# Patient Record
Sex: Male | Born: 1965 | Race: White | Hispanic: No | Marital: Married | Smoking: Never smoker
Health system: Southern US, Community
[De-identification: ages and names within clinical notes are randomized; demographics above are authoritative.]

## PROBLEM LIST (undated history)

## (undated) DIAGNOSIS — T7840XA Allergy, unspecified, initial encounter: Secondary | ICD-10-CM

## (undated) HISTORY — DX: Allergy, unspecified, initial encounter: T78.40XA

---

## 1996-08-15 HISTORY — PX: AMPUTATION FINGER: SHX6594

## 2009-07-01 ENCOUNTER — Encounter: Admission: RE | Admit: 2009-07-01 | Discharge: 2009-07-01 | Payer: Self-pay | Admitting: Family Medicine

## 2010-04-02 IMAGING — CR DG LUMBAR SPINE COMPLETE 4+V
5 series · 5 of 5 positions shown · non-contrast
Comparison: None

CLINICAL DATA: Low back pain.

LUMBAR SPINE - COMPLETE 4+ VIEW

[view not recorded (1 of 5)]
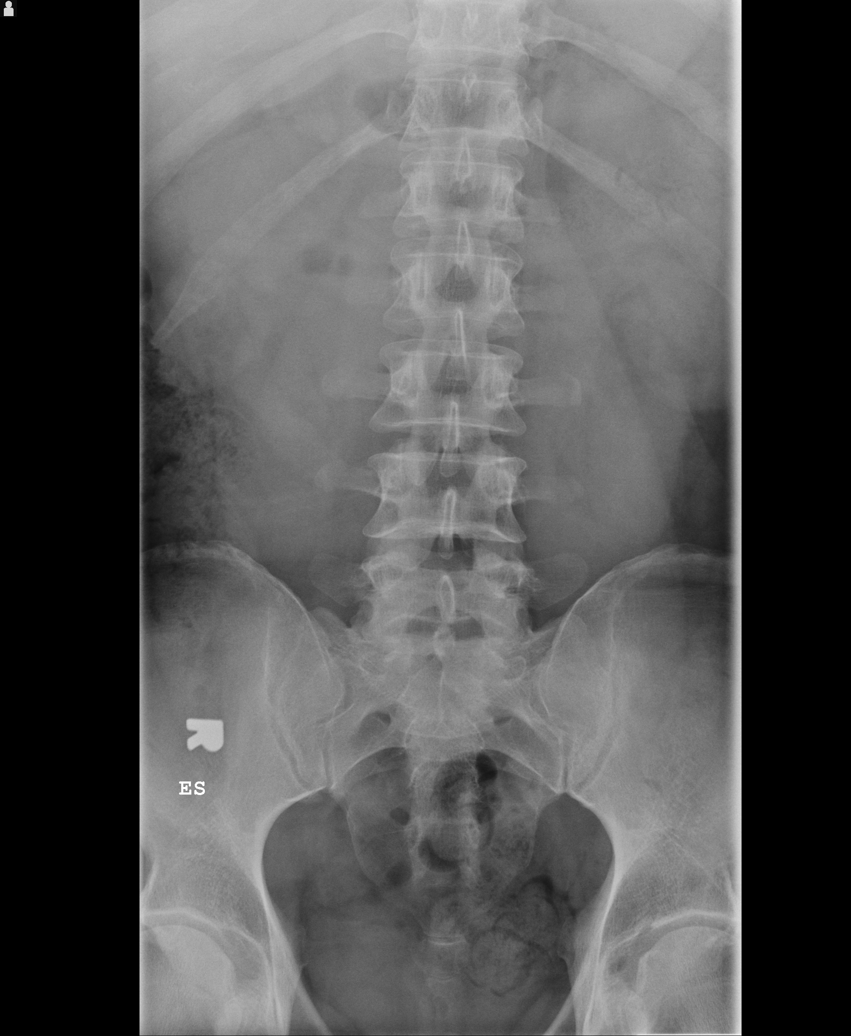

[view not recorded (2 of 5)]
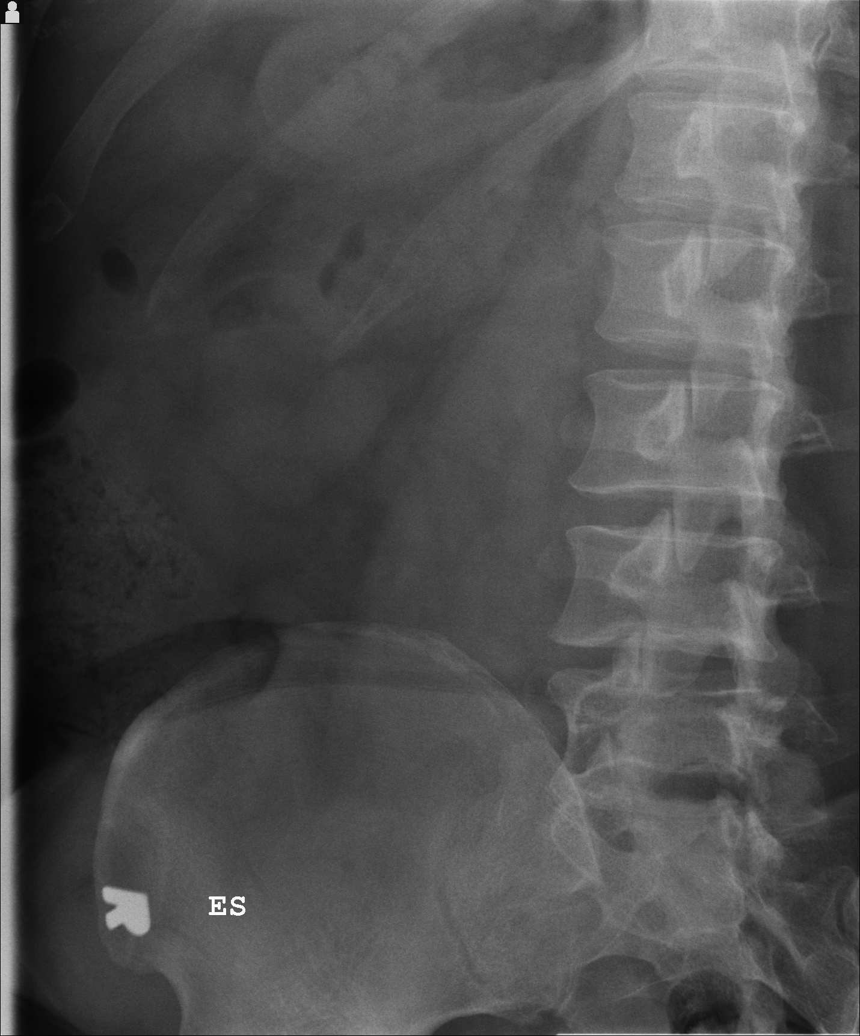

[view not recorded (3 of 5)]
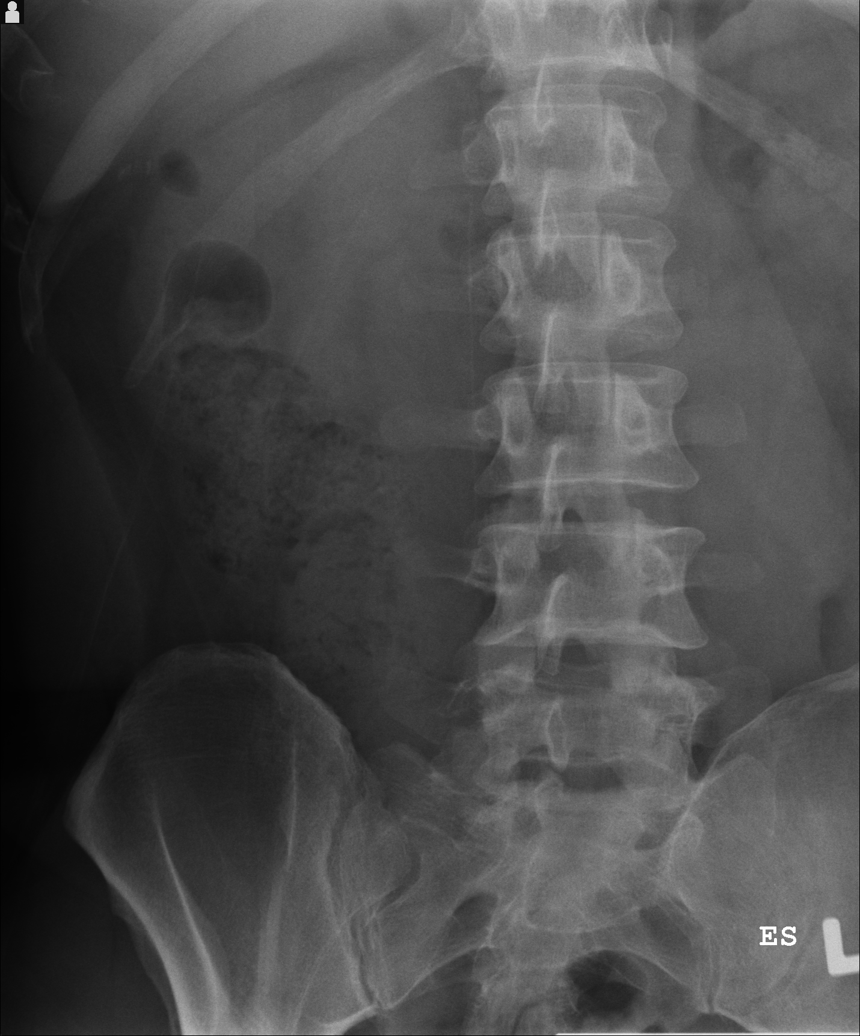

[view not recorded (4 of 5)]
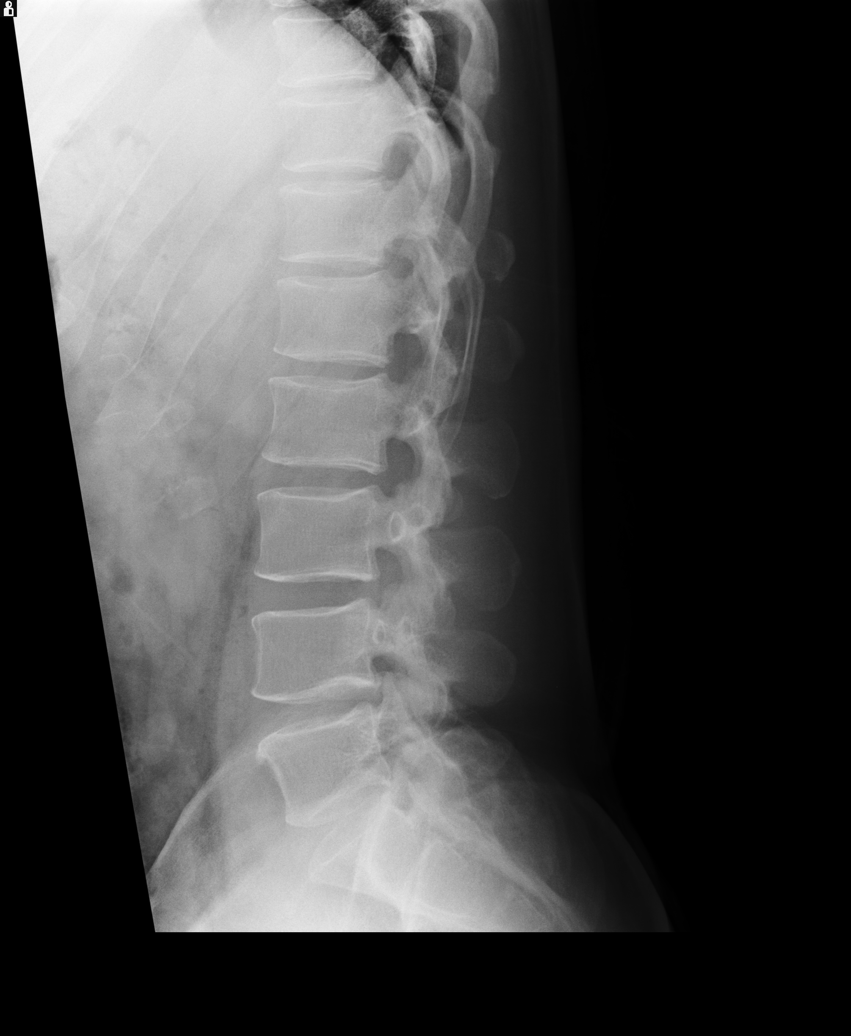

[view not recorded (5 of 5)]
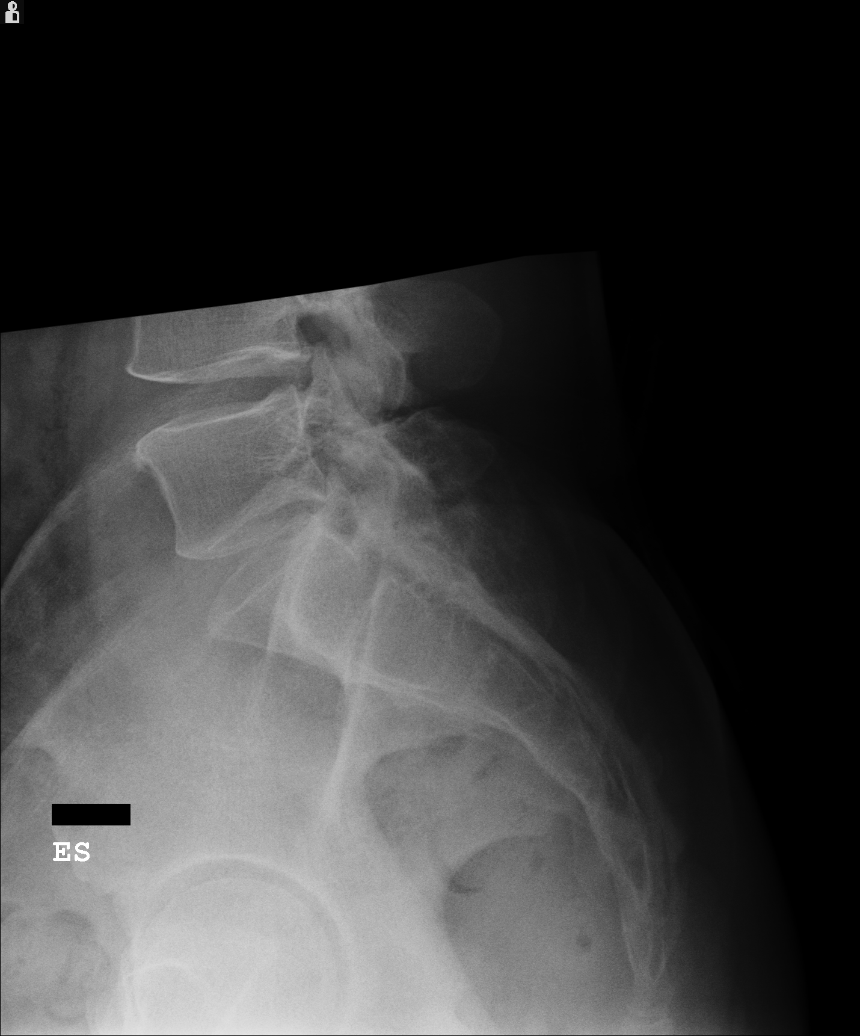

[5 of 5 positions shown; findings below may reference images not displayed]

FINDINGS: No disc space narrowing.  Intact vertebral bodies and
posterior processes.  No spondylolisthesis.  SI joints
unremarkable.  Minimal osteophytic formation of the lower lumbar
spine.
IMPRESSION: Minimal degenerative changes.

## 2013-10-31 ENCOUNTER — Encounter: Payer: Self-pay | Admitting: Orthopedic Surgery

## 2013-11-13 ENCOUNTER — Encounter: Payer: Self-pay | Admitting: Orthopedic Surgery

## 2013-12-13 ENCOUNTER — Encounter: Payer: Self-pay | Admitting: Orthopedic Surgery

## 2015-02-24 ENCOUNTER — Ambulatory Visit: Payer: Worker's Compensation | Attending: Neurology

## 2015-02-24 DIAGNOSIS — M545 Low back pain, unspecified: Secondary | ICD-10-CM

## 2015-02-24 DIAGNOSIS — M546 Pain in thoracic spine: Secondary | ICD-10-CM

## 2015-02-24 DIAGNOSIS — M542 Cervicalgia: Secondary | ICD-10-CM

## 2015-02-24 NOTE — Patient Instructions (Signed)
Improved exercise technique, movement at target joints, use of target muscles after mod verbal, visual, tactile cues.    Gave L lateral shift correction as part of his HEP. Pt demonstrated and verbalized understanding.
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
## 2015-03-03 ENCOUNTER — Ambulatory Visit: Payer: Worker's Compensation

## 2015-03-03 DIAGNOSIS — M545 Low back pain, unspecified: Secondary | ICD-10-CM

## 2015-03-03 DIAGNOSIS — M546 Pain in thoracic spine: Secondary | ICD-10-CM

## 2015-03-03 DIAGNOSIS — M542 Cervicalgia: Secondary | ICD-10-CM

## 2015-03-03 NOTE — Therapy (Signed)
[facility] PHYSICAL AND SPORTS MEDICINE [address] [place], [place], [address] Phone: [phone]   Fax:  [phone]  Physical Therapy Treatment  Patient Details  Name: Tristan Long MRN: [id number] Date of Birth: February 04, 1966 Referring Provider:  Rosary LivelyPasi, Sonia, MD  Encounter Date: 03/03/2015      PT End of Session - 03/03/15 1120    PT Start Time 1120   PT Stop Time 1137   PT Time Calculation (min) 17 min      Past Medical History  Diagnosis Date  . Allergy     seasonal    Past Surgical History  Procedure Laterality Date  . 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  Visit Diagnosis:  Neck pain  Bilateral low back pain without sciatica  Bilateral thoracic back pain      Subjective Assessment - 03/03/15 1122    Subjective Pt states that one of his doctors suggested a fusion for his neck. Neck felt better after last session. Pt also brought MRI results (copy) for his neck and back.            PT Long Term Goals - 02/24/15 2105    PT LONG TERM GOAL #1   Title Patient will be independent with his HEP to promote ability to perform functional tasks.    Time 6   Period Weeks   Status New   PT LONG TERM GOAL #2   Title Patient will report a decrease in neck pain to 2/10 or less at worst to improve ability to look around with less pain.   Time 6   Period Weeks   PT LONG TERM GOAL #3   Title Patient will report a decrease in back pain to 2/10 or less at worst to improve ability to perform functional tasks, perform driving tactics and defense tactics.    Time 6   Period Weeks   Status New   PT LONG TERM GOAL #4   Title Patient will improve his Neck Disability Index Score by at least 5 points as a demonstration of improved function.    Baseline 36%   Time 6   Period Weeks   Status New   PT LONG TERM GOAL #5   Title Patient will improve his Quick Dash  Score by at least 10% as a demonstration of improved function.    Baseline 20.5%   Time 6   Period Weeks   Status New               Plan - 03/03/15 1138    Clinical Impression Statement Unable to treat patient today secondary to insurance unable to get back to us to let us know if his insurance approved follow-up visits for physical therapy.    Consulted and Agree with Plan of Care Patient        Problem List There are no active problems to display for this patient.   Catie Chiao 03/03/2015, 11:41 AM  [place] [facility] PHYSICAL AND SPORTS MEDICINE [address] [place], [place], [address] Phone: [phone]   Fax:  [phone]

## 2015-03-05 ENCOUNTER — Ambulatory Visit: Payer: Worker's Compensation

## 2015-03-05 DIAGNOSIS — M545 Low back pain, unspecified: Secondary | ICD-10-CM

## 2015-03-05 DIAGNOSIS — M546 Pain in thoracic spine: Secondary | ICD-10-CM

## 2015-03-05 DIAGNOSIS — M542 Cervicalgia: Secondary | ICD-10-CM | POA: Diagnosis not present

## 2015-03-05 NOTE — Therapy (Signed)
[place] [facility] PHYS[facility] SPORTS MEDICINE [address], [place], [address] Phone: [phone]   Fax:  [phone]  Physical Therapy Treatment  Patient Details  Name: TAHSIN BENYO MRN: [id number] Date of Birth: 1966-02-12 Referring Provider:  Rosary Lively, MD  Encounter Date: 03/05/2015      PT End of Session - 03/05/15 1117    Visit Number 2   Number of Visits 13   Date for PT Re-Evaluation 04/09/15   Authorization Type 1   Authorization Time Period of 6 authorized visits   PT Start Time 1117   PT Stop Time 1214   PT Time Calculation (min) 57 min   Activity Tolerance Patient tolerated treatment well;No increased pain   Behavior During Therapy [facility] for tasks assessed/performed      Past Medical History  Diagnosis Date  . Allergy     seasonal    Past Surgical History  Procedure Laterality Date  . 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  Visit Diagnosis:  Neck pain  Bilateral low back pain without sciatica  Bilateral thoracic back pain      Subjective Assessment - 03/05/15 1119    Subjective Pt states that his neck has felt better since the last couple of days.    Patient Stated Goals "To have less pain, to be able to function a little bit better."   Currently in Pain? Yes   Pain Score 2   neck pain   Multiple Pain Sites Yes           [name] - 03/05/15 1131    Exercises   Other Exercises  Directed patient with L scapular retraction 10x5 seconds then 7x 5 seconds; R shoulder extension (green band) 10x with L cerv rot, R scapular retraction with L cervical rotation 3x (gave R scap retract with L cerv rot 10x3 as part of HEP; pt demonstrated and verbalized understanding)    Manual Therapy   Manual therapy comments Seated L to R transverse glide to T1 spinous process with L cervical rotation 10x3, prone R UPA to R T2  and R T1 transverse processes grade 3-, prone sustained pressure (grade 3-) to R T1 and T2 transverse processes, gentle soft tissue mobilization to L T1 and T2 paraspinals  .           PT Long Term Goals - 02/24/15 2105    PT LONG TERM GOAL #1   Title Patient will be independent with his HEP to promote ability to perform functional tasks.    Time 6   Period Weeks   Status New   PT LONG TERM GOAL #2   Title Patient will report a decrease in neck pain to 2/10 or less at worst to improve ability to look around with less pain.   Time 6   Period Weeks   PT LONG TERM GOAL #3   Title Patient will report a decrease in back pain to 2/10 or less at worst to improve ability to perform functional tasks, perform driving tactics and defense tactics.    Time 6   Period Weeks   Status New   PT LONG TERM GOAL #4   Title Patient will improve his Neck Disability Index Score by at least 5 points as a demonstration of improved function.    Baseline 36%   Time 6   Period  Weeks   Status New   PT LONG TERM GOAL #5   Title Patient will improve his Quick Dash Score by at least 10% as a demonstration of improved function.    Baseline 20.5%   Time 6   Period Weeks   Status New               Plan - 03/05/15 1508    Clinical Impression Statement Decreased neck stiffness with L cervical rotation after session. Dizziness, L shoulder and arm tingling, and nausea at abdomen felt with manual therapy to R T1, and T 2 transverse process. Disappeared with rest.    Pt will benefit from skilled therapeutic intervention in order to improve on the following deficits Increased muscle spasms;Pain;Improper body mechanics;Hypomobility;Postural dysfunction   Rehab Potential Good   PT Frequency 2x / week   PT Duration 6 weeks   PT Treatment/Interventions Therapeutic exercise;Therapeutic activities;Manual techniques;Moist Heat;Iontophoresis 4mg /ml Dexamethasone;Electrical Stimulation;Cryotherapy;Patient/family  education;Passive range of motion;Aquatic Therapy;Neuromuscular re-education;Ultrasound;ADLs/Self Care Home Management   PT Next Visit Plan Joint mobilizations, cervical, and lumbopelvic stability, posture   Consulted and Agree with Plan of Care Patient        Problem List There are no active problems to display for this patient.   Laygo,Miguel 03/05/2015, 3:35 PM  [facility] PHYSICAL [facility] SPORTS MEDICINE [address], [place], [address] Phone: [phone]   Fax:  [phone]

## 2015-03-05 NOTE — Patient Instructions (Signed)
Gave R scapular retraction with L cervical rotation 10x3 as part of his HEP. Pt demonstrated and verbalized understanding.     Improved exercise technique, movement at target joints, use of target muscles after mod verbal, visual, tactile cues.

## 2015-03-10 ENCOUNTER — Ambulatory Visit: Payer: Worker's Compensation

## 2015-03-10 DIAGNOSIS — M546 Pain in thoracic spine: Secondary | ICD-10-CM

## 2015-03-10 DIAGNOSIS — M545 Low back pain, unspecified: Secondary | ICD-10-CM

## 2015-03-10 DIAGNOSIS — M542 Cervicalgia: Secondary | ICD-10-CM | POA: Diagnosis not present

## 2015-03-10 NOTE — Therapy (Signed)
[facility] [address] [place], [address] Phone: [phone]   Fax:  [phone]  Physical Therapy Treatment  Patient Details  Name: Tristan Long MRN: [id number] Date of Birth: 1966/05/07 Referring Provider:  Rosary Lively, MD  Encounter Date: 03/10/2015      PT End of Session - 03/10/15 1652    Visit Number 3   Number of Visits 13   Date for PT Re-Evaluation 04/09/15   Authorization Type 2   Authorization Time Period of 6 authorized visits   PT Start Time 1652   PT Stop Time 1742   PT Time Calculation (min) 50 min   Activity Tolerance Patient tolerated treatment well   Behavior During Therapy [facility] for tasks assessed/performed      Past Medical History  Diagnosis Date  . Allergy     seasonal    Past Surgical History  Procedure Laterality Date  . 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  Visit Diagnosis:  Neck pain  Bilateral low back pain without sciatica  Bilateral thoracic back pain      Subjective Assessment - 03/10/15 1858    Subjective 2/10 neck pain currently. 4/10 after last session.    Patient Stated Goals "To have less pain, to be able to function a little bit better."   Currently in Pain? Yes   Pain Score 2    Pain Location Neck   Multiple Pain Sites Yes            [facility] PT Assessment - 03/10/15 1704    Observation/Other Assessments   Observations (-) VBI            [name] - 03/10/15 1724    Exercises   Other Exercises  Directed patient with manually resisted R scap retraction with L cervical rotation 10x5 seconds, supine bilateral shoulder flexion with towel roll behind mid thoracic spine 3x, supine lower trunk rotation 10x10 seconds to the R and to the L, standing L shoulder extension red band 10x2 with 5 seconds, standing L lat pull downs with red band 10x  with 5 seconds. Reviewed and gave standing L shoulder extension and standing L lat pull downs as part of his HEP. Pt demonstrated and verbalized understanding.    Manual Therapy   Manual therapy comments Prone P to A to L T5 transverse process grade 3- to 3. Sustained P to A pressure to L T5, and T6 transverse process.                 PT Education - 03/10/15 1903    Education provided Yes   Education Details ther-ex, HEP   Person(s) Educated Patient   Methods Explanation;Demonstration;Tactile cues;Verbal cues   Comprehension Verbalized understanding;Returned demonstration             [name] - 02/24/15 2105    PT LONG TERM GOAL #1   Title Patient will be independent with his HEP to promote ability to perform functional tasks.    Time 6   Period Weeks   Status New   PT LONG TERM GOAL #2   Title Patient will report a decrease in neck pain to 2/10 or less at worst to improve ability to look around with less pain.   Time 6   Period Weeks   PT LONG TERM GOAL #3  Title Patient will report a decrease in back pain to 2/10 or less at worst to improve ability to perform functional tasks, perform driving tactics and defense tactics.    Time 6   Period Weeks   Status New   PT LONG TERM GOAL #4   Title Patient will improve his Neck Disability Index Score by at least 5 points as a demonstration of improved function.    Baseline 36%   Time 6   Period Weeks   Status New   PT LONG TERM GOAL #5   Title Patient will improve his Quick Dash Score by at least 10% as a demonstration of improved function.    Baseline 20.5%   Time 6   Period Weeks   Status New               Plan - 03/10/15 1742    Clinical Impression Statement Discomfort with P to A pressure to T4-T6. Pt demonstrates increased R lumbar paraspinal muscle tone. Pt states neck and back feeling better after L shoulder extension and L lat pull down exercises.    Pt will benefit from skilled  therapeutic intervention in order to improve on the following deficits Increased muscle spasms;Pain;Improper body mechanics;Hypomobility;Postural dysfunction   Rehab Potential Good   PT Frequency 2x / week   PT Duration 6 weeks   PT Treatment/Interventions Therapeutic exercise;Therapeutic activities;Manual techniques;Moist Heat;Iontophoresis /ml Dexamethasone;Electrical Stimulation;Cryotherapy;Patient/family education;Passive range of motion;Aquatic Therapy;Neuromuscular re-education;Ultrasound;ADLs/Self Care Home Management   PT Next Visit Plan Reciprocal inhibition, joint mobilizations, cervical, and lumbopelvic stability, posture   Consulted and Agree with Plan of Care Patient        Problem List There are no active problems to display for this patient.   Tristan Long 03/10/2015, 7:11 PM  [place] [facility] PHYSICAL AND SPORTS MEDICINE [address] [place], [address] Phone: [phone]   Fax:  [phone]

## 2015-03-10 NOTE — Patient Instructions (Signed)
Gave L shoulder extension with red band, L lat pull down with red band 10x2 with 5 seconds each as part of his HEP. Pt demonstrated and verbalized understanding.   Improved exercise technique, movement at target joints, use of target muscles after mod verbal, visual, tactile cues.

## 2015-03-12 ENCOUNTER — Ambulatory Visit: Payer: Worker's Compensation

## 2015-03-12 DIAGNOSIS — M545 Low back pain, unspecified: Secondary | ICD-10-CM

## 2015-03-12 DIAGNOSIS — M542 Cervicalgia: Secondary | ICD-10-CM

## 2015-03-12 DIAGNOSIS — M546 Pain in thoracic spine: Secondary | ICD-10-CM

## 2015-03-12 NOTE — Therapy (Signed)
[facility] PH[facility]ORTS MEDICINE [address] [place], [address] Phone: [phone]   Fax:  [phone]  Physical Therapy Treatment  Patient Details  Name: Tristan Long MRN: [id number] Date of Birth: [birth year] Referring Provider:  Rosary Lively, MD  Encounter Date: 03/12/2015      PT End of Session - 03/12/15 0901    Visit Number 4   Number of Visits 13   Date for PT Re-Evaluation 04/09/15   Authorization Type 3   Authorization Time Period of 6 authorized visits   PT Start Time 0900   PT Stop Time 0951   PT Time Calculation (min) 51 min   Activity Tolerance Patient tolerated treatment well   Behavior During Therapy [facility] for tasks assessed/performed      Past Medical History  Diagnosis Date  . Allergy     seasonal    Past Surgical History  Procedure Laterality Date  . 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  Visit Diagnosis:  Neck pain  Bilateral low back pain without sciatica  Bilateral thoracic back pain      Subjective Assessment - 03/12/15 0904    Subjective 5/10 yesterday. Did neck rotations while doing elliptical machine. Also tried performing trunk rotations. Had to take tramadol yesterday because of pain. 2/10 neck, thoracic spine, and low back currently.    Patient Stated Goals "To have less pain, to be able to function a little bit better."   Currently in Pain? Yes   Pain Score 2    Pain Location --  neck, back   Multiple Pain Sites Yes             [name] - 03/12/15 0930    Exercises   Other Exercises  Directed patient with L lat pull-down 10x2 with 5 second holds, L shoulder extension red band 10x2 with 5 seconds holds,then with black resistive band preventing R T9/T10 side bend 10x5 seconds, L shoulder adduction red band 10x5 seconds, Pt education on dermatomes thoracic spine;  red T-band  pall off press for L 10x5 seconds. Reviewed L shoulder extension resisting red band  with black band resising R thoracic side bend (drawing provided) for his HEP. Pt verbalized understanding.                 PT Education - 03/12/15 1950    Education provided Yes   Education Details ther-ex., HEP   Person(s) Educated Patient   Methods Explanation;Demonstration;Tactile cues;Verbal cues;Handout   Comprehension Verbalized understanding;Returned demonstration             PT Long Term Goals - 02/24/15 2105    PT LONG TERM GOAL #1   Title Patient will be independent with his HEP to promote ability to perform functional tasks.    Time 6   Period Weeks   Status New   PT LONG TERM GOAL #2   Title Patient will report a decrease in neck pain to 2/10 or less at worst to improve ability to look around with less pain.   Time 6   Period Weeks   PT LONG TERM GOAL #3   Title Patient will report a decrease in back pain to 2/10 or less at worst to improve ability to perform functional tasks, perform driving tactics and defense tactics.    Time 6   Period Weeks   Status  New   PT LONG TERM GOAL #4   Title Patient will improve his Neck Disability Index Score by at least 5 points as a demonstration of improved function.    Baseline 36%   Time 6   Period Weeks   Status New   PT LONG TERM GOAL #5   Title Patient will improve his Quick Dash Score by at least 10% as a demonstration of improved function.    Baseline 20.5%   Time 6   Period Weeks   Status New               Plan - 03/12/15 1610    Clinical Impression Statement Decreased "sticking" sensation to lower thoracic spine and improved spinal posture after adding black band resistance to R side bend around T9/T10 area during L shoulder extension exercise. Per pt, back felt better with L shoulder extension exercise.    Pt will benefit from skilled therapeutic intervention in order to improve on the following deficits  Increased muscle spasms;Pain;Improper body mechanics;Hypomobility;Postural dysfunction   Rehab Potential Good   PT Frequency 2x / week   PT Duration 6 weeks   PT Treatment/Interventions Therapeutic exercise;Therapeutic activities;Manual techniques;Moist Heat;Iontophoresis /ml Dexamethasone;Electrical Stimulation;Cryotherapy;Patient/family education;Passive range of motion;Aquatic Therapy;Neuromuscular re-education;Ultrasound;ADLs/Self Care Home Management   PT Next Visit Plan Reciprocal inhibition, joint mobilizations, cervical, and lumbopelvic stability, posture   Consulted and Agree with Plan of Care Patient        Problem List There are no active problems to display for this patient.   Maisha Bogen 03/12/2015, 7:55 PM  [place] [facility] PHYSICAL AND SPORTS MEDICINE [address] [place], [address] Phone: [phone]   Fax:  [phone]

## 2015-03-12 NOTE — Patient Instructions (Signed)
Improved exercise technique, movement at target joints, use of target muscles after mod verbal, visual, tactile cues.   Gave L shoulder extension resisting red band with black band preventing R thoracic side bend as part of his HEP. Drawing provided. Pt verbalized understanding.

## 2015-03-17 ENCOUNTER — Ambulatory Visit: Payer: Worker's Compensation | Attending: Neurology

## 2015-03-17 DIAGNOSIS — M545 Low back pain, unspecified: Secondary | ICD-10-CM

## 2015-03-17 DIAGNOSIS — M542 Cervicalgia: Secondary | ICD-10-CM | POA: Diagnosis not present

## 2015-03-17 DIAGNOSIS — M546 Pain in thoracic spine: Secondary | ICD-10-CM

## 2015-03-17 NOTE — Patient Instructions (Addendum)
Gave picture handout of L shoulder lat pull down resisting red band as part of his HEP. Pt demonstrated and verbalized understanding.   Improved exercise technique, movement at target joints, use of target muscles after mod verbal, visual, tactile cues.

## 2015-03-17 NOTE — Therapy (Signed)
[facility] [facility] PHYS[facility]ORTS MEDICINE [address] [place], [address] Phone: [phone]   Fax:  [phone]  Physical Therapy Treatment  Patient Details  Name: Tristan Long MRN: [id number] Date of Birth: 1966-01-23 Referring Provider:  Rosary Lively, MD  Encounter Date: 03/17/2015      PT End of Session - 03/17/15 0912    Visit Number 5   Number of Visits 13   Date for PT Re-Evaluation 04/09/15   Authorization Type 4   Authorization Time Period of 6 authorized visits   PT Start Time 0903   PT Stop Time 0959   PT Time Calculation (min) 56 min   Activity Tolerance Patient tolerated treatment well   Behavior During Therapy [facility] for tasks assessed/performed      Past Medical History  Diagnosis Date  . Allergy     seasonal    Past Surgical History  Procedure Laterality Date  . 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  Visit Diagnosis:  Neck pain  Bilateral low back pain without sciatica  Bilateral thoracic back pain      Subjective Assessment - 03/17/15 0903    Subjective Pt states that his eye is bothering him today. Got saw dust in his eye this weekend. Going to see an eye doctor after today's session. The exercises at home helps. Was able to shop for longer this weekend. 2/10 neck and back currently.  2/10 at most for the past few days including this weekend (4 days)    Patient Stated Goals "To have less pain, to be able to function a little bit better."   Currently in Pain? Yes   Pain Score 2    Pain Location --  neck and back   Multiple Pain Sites Yes            [name] - 03/17/15 0920    Exercises   Other Exercises  Directed patient with L shoulder extension resisting red band 10x3 with 5 second holds with manual pressure preventing R side bend at T9/10, L SLS with bilateral finger touch assist 3x 10  seconds, then 10x5 seconds; L lat pull down red band 10x3 with 5 second holds. seated manually resisted L hip flexion (L shoulder extension) 10x2 with 5 second holds, standing L shoulder adduction red band 10x, R shoulder retraction and shrug with slight L cervical rotation 10x5 seconds, manually resisted R cervical retraction 5x5 seconds, manually resisted L cervical retraction 5x5 seconds for 6 sets.            PT Education - 03/17/15 1343    Education provided Yes   Education Details ther-ex, HEP   Person(s) Educated Patient   Methods Demonstration;Tactile cues;Verbal cues   Comprehension Verbalized understanding;Returned demonstration             PT Long Term Goals - 02/24/15 2105    PT LONG TERM GOAL #1   Title Patient will be independent with his HEP to promote ability to perform functional tasks.    Time 6   Period Weeks   Status New   PT LONG TERM GOAL #2   Title Patient will report a decrease in neck pain to 2/10 or less at worst to improve ability to look around with less pain.   Time 6   Period Weeks   PT LONG TERM GOAL #3  Title Patient will report a decrease in back pain to 2/10 or less at worst to improve ability to perform functional tasks, perform driving tactics and defense tactics.    Time 6   Period Weeks   Status New   PT LONG TERM GOAL #4   Title Patient will improve his Neck Disability Index Score by at least 5 points as a demonstration of improved function.    Baseline 36%   Time 6   Period Weeks   Status New   PT LONG TERM GOAL #5   Title Patient will improve his Quick Dash Score by at least 10% as a demonstration of improved function.    Baseline 20.5%   Time 6   Period Weeks   Status New           Plan - 03/17/15 1610    Clinical Impression Statement Slight increase in neck discomfort after gentle manually resisted R cervical retraction. Slight decrease in neck "sticking"/ discomfort after gentle manually resisted L cervical  retraction.  Standing L shoulder extension and L lat pulldown resisting red band promotes gentle L thoracic and lumbar rotation upon observation.    Pt will benefit from skilled therapeutic intervention in order to improve on the following deficits Increased muscle spasms;Pain;Improper body mechanics;Hypomobility;Postural dysfunction   Rehab Potential Good   PT Frequency 2x / week   PT Duration 6 weeks   PT Treatment/Interventions Therapeutic exercise;Therapeutic activities;Manual techniques;Moist Heat;Iontophoresis /ml Dexamethasone;Electrical Stimulation;Cryotherapy;Patient/family education;Passive range of motion;Aquatic Therapy;Neuromuscular re-education;Ultrasound;ADLs/Self Care Home Management   PT Next Visit Plan Reciprocal inhibition, joint mobilizations, cervical, and lumbopelvic stability, posture   Consulted and Agree with Plan of Care Patient        Problem List There are no active problems to display for this patient.   Amellia Panik 03/17/2015, 3:16 PM  [place] [facility] PHYSICAL AND SPORTS MEDICINE [address] [place], [address] Phone: [phone]   Fax:  [phone]

## 2015-03-19 ENCOUNTER — Ambulatory Visit: Payer: Worker's Compensation

## 2015-03-19 DIAGNOSIS — M545 Low back pain, unspecified: Secondary | ICD-10-CM

## 2015-03-19 DIAGNOSIS — M546 Pain in thoracic spine: Secondary | ICD-10-CM

## 2015-03-19 DIAGNOSIS — M542 Cervicalgia: Secondary | ICD-10-CM | POA: Diagnosis not present

## 2015-03-19 NOTE — Therapy (Signed)
[place] [facility] PH[facility]ORTS MEDICINE [address], [place], [address] Phone: [phone]   Fax:  [phone]  Physical Therapy Treatment  Patient Details  Name: Tristan Long MRN: [id number] Date of Birth: [birth year] Referring Provider:  Rosary Lively, MD  Encounter Date: 03/19/2015      PT End of Session - 03/19/15 0909    Visit Number 6   Number of Visits 13   Date for PT Re-Evaluation 04/09/15   Authorization Type 5   Authorization Time Period of 6 authorized visits   PT Start Time 0908   PT Stop Time 0951   PT Time Calculation (min) 43 min   Activity Tolerance Patient tolerated treatment well   Behavior During Therapy [facility] for tasks assessed/performed      Past Medical History  Diagnosis Date  . Allergy     seasonal    Past Surgical History  Procedure Laterality Date  . 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  Visit Diagnosis:  Neck pain  Bilateral low back pain without sciatica  Bilateral thoracic back pain      Subjective Assessment - 03/19/15 0909    Subjective Pt states that his eye feels better. Did some exercises which helped his arm feel more mobile. Better able to turn to the L with his neck. Feels some stiffness with R cervical rotation. Sitting up straight feels good for his back, slouching down increases the "sticking" sensation in his back.    Patient Stated Goals "To have less pain, to be able to function a little bit better."   Currently in Pain? Yes   Pain Score 2   2/10 neck, 3/10 back   Multiple Pain Sites Yes           [name] - 03/19/15 0921    Exercises   Other Exercises  Directed patient with seated L hip flexion isometrics 10x5 seconds  (resisting with L UE), then 10x5 seconds resisting with R UE; seated R shoulder scaption resisting yellow band 10x, R shoulder horizontal abduction  resisting yellow band 10x, then 5x3; standing L shoulder extension resisting red band 10x3, standing R shoulder abduction resising 2 lb weight 10x3, static mini lunge L LE only 10x3          PT Education - 03/19/15 2014    Education provided Yes   Education Details ther-ex, HEP   Person(s) Educated Patient   Methods Explanation;Demonstration;Tactile cues;Verbal cues;Handout   Comprehension Verbalized understanding;Returned demonstration           PT Long Term Goals - 02/24/15 2105    PT LONG TERM GOAL #1   Title Patient will be independent with his HEP to promote ability to perform functional tasks.    Time 6   Period Weeks   Status New   PT LONG TERM GOAL #2   Title Patient will report a decrease in neck pain to 2/10 or less at worst to improve ability to look around with less pain.   Time 6   Period Weeks   PT LONG TERM GOAL #3   Title Patient will report a decrease in back pain to 2/10 or less at worst to improve ability to perform functional tasks, perform driving tactics and defense tactics.    Time 6   Period Weeks   Status New   PT LONG TERM GOAL #4  Title Patient will improve his Neck Disability Index Score by at least 5 points as a demonstration of improved function.    Baseline 36%   Time 6   Period Weeks   Status New   PT LONG TERM GOAL #5   Title Patient will improve his Quick Dash Score by at least 10% as a demonstration of improved function.    Baseline 20.5%   Time 6   Period Weeks   Status New           Plan - 03/19/15 0940    Clinical Impression Statement Decreased mid back "sticking" sensation and improved T9/T10 posture with standing R shoulder abduction resisting 2 lbs.   Pt will benefit from skilled therapeutic intervention in order to improve on the following deficits Increased muscle spasms;Pain;Improper body mechanics;Hypomobility;Postural dysfunction   Rehab Potential Good   PT Frequency 2x / week   PT Duration 6 weeks   PT  Treatment/Interventions Therapeutic exercise;Therapeutic activities;Manual techniques;Moist Heat;Iontophoresis 4mg /ml Dexamethasone;Electrical Stimulation;Cryotherapy;Patient/family education;Passive range of motion;Aquatic Therapy;Neuromuscular re-education;Ultrasound;ADLs/Self Care Home Management   PT Next Visit Plan Reciprocal inhibition, joint mobilizations, cervical, and lumbopelvic stability, posture   Consulted and Agree with Plan of Care Patient        Problem List There are no active problems to display for this patient.   Imani Fiebelkorn 03/19/2015, 8:19 PM  [place] [facility] PHYSICAL [facility] SPORTS MEDICINE [address], [place], [address] Phone: [phone]   Fax:  [phone]

## 2015-03-19 NOTE — Patient Instructions (Addendum)
Abduction (Dumbbell)   Stand with arms at sides. Lift RIGHT arm only  out from side, palm down. Repeat __10__ times per set. Do __3__ sets per session. Daily. Use __2__ lb weights.  Body-Weight Forward Lunge: Unstable - Stationary (Active)   Stand in wide stride, legs shoulder width apart, head up, back flat. Take a step forward with your LEFT leg only as you do a slight squat.    Complete __3_ sets of __10_ repetitions. Perform __1_ sessions per day.  Copyright  VHI. All rights reserved.   Copyright  VHI. All rights reserved.    Improved exercise technique, movement at target joints, use of target muscles after mod verbal, visual, tactile cues.

## 2015-03-24 ENCOUNTER — Ambulatory Visit: Payer: Worker's Compensation

## 2015-03-24 DIAGNOSIS — M545 Low back pain, unspecified: Secondary | ICD-10-CM

## 2015-03-24 DIAGNOSIS — M542 Cervicalgia: Secondary | ICD-10-CM | POA: Diagnosis not present

## 2015-03-24 DIAGNOSIS — M546 Pain in thoracic spine: Secondary | ICD-10-CM

## 2015-03-24 NOTE — Therapy (Signed)
[place] [facility] PHYSICAL AND SPORTS MEDICINE [address] [place], [address] Phone: [phone]   Fax:  [phone]  Physical Therapy Treatment  Patient Details  Name: Tristan Long MRN: [id number] Date of Birth: September 08, 1965 Referring Provider:  Rosary Lively, MD  Encounter Date: 03/24/2015      PT End of Session - 03/24/15 0903    Visit Number 7   Number of Visits 19  7 (7 visits currently) +12 (2x/week for 6 weeks) = 19   Date for PT Re-Evaluation 05/07/15   Authorization Type 6   Authorization Time Period of 6 authorized visits   PT Start Time 0902   PT Stop Time 1007   PT Time Calculation (min) 65 min   Activity Tolerance Patient tolerated treatment well   Behavior During Therapy [facility] for tasks assessed/performed      Past Medical History  Diagnosis Date  . Allergy     seasonal    Past Surgical History  Procedure Laterality Date  . 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  Visit Diagnosis:  Neck pain - Plan: PT plan of care cert/re-cert  Bilateral low back pain without sciatica - Plan: PT plan of care cert/re-cert  Bilateral thoracic back pain - Plan: PT plan of care cert/re-cert      Subjective Assessment - 03/24/15 0903    Subjective Pt states that his neck feels stiff this morning. Hurt a lot yesterday, primarily on the R side to the spot in his mid back where it "sticks." Neck pain current 3/10 ("sorelike"), 5/10 neck pain yesterday. Worked on elliptical saturday in yesterday involving arm handles.  Neck was a little sore the next day after last session but not really that bad. Had to take a muscle relaxer to help him sleep. Back is not really that much better, still gets the sticking pain.  Feels like he has better range of motion on the L side now.  Feels like therapy is helping him get more use of his mucles.  Back pain worst 5/10, and 4/10  currently.  Started feeling numbness in R arm starting Saturday night when he put his R arm around his wife. R UE numbness lasted for an hour.    Patient Stated Goals "To have less pain, to be able to function a little bit better."   Currently in Pain? Yes   Pain Score 4   please see subjective. 3/10 neck pain, 4/10 back pain   Pain Location --  neck and back   Multiple Pain Sites Yes  neck and back            [facility] PT Assessment - 03/24/15 0912    Observation/Other Assessments   Neck Disability Index  30 %    Quick DASH  38.6%   AROM   Cervical Flexion limited   Cervical Extension limited with discomfort to T1. Movement preference to T1/C7, C6/C7   Cervical - Right Side Bend limited with stiffness, discomfort around T1/C7   Cervical - Left Side Bend limited with stiffness (less discomfort compared to R side bend)   Improved since eval   Cervical - Right Rotation limited with discomfort to T1   Cervical - Left Rotation limited without discomfort  Improved since eval   Lumbar Flexion limited with "sticking" sensation to around T9/T10 and discomfort towards upper thoracic spine   Lumbar Extension limited  with "sticking" sensation at T9/T10  No "sticking" sensation after L S/L with pillow position   Lumbar - Right Side Bend Limited with L lower trunk pain   Lumbar - Left Side Bend limited, no pain  Improved since eval   Lumbar - Right Rotation Limited, no pain  Improved since eval   Lumbar - Left Rotation limited with "sticking" pain at T9/T10          [facility] Adult PT Treatment/Exercise - 03/24/15 9604    Exercises   Other Exercises  Directed patient with L S/L position with pillow under T9/T0 x 5 min, then with R shoulder abduction 3 lbs 10x3, then with R hip abduction 10x3, then 7 more times, standing L shoulder extension resisting red band 10x3 with 5 second  holds, standing cervical AROM all planes and lumbar AROM all planes 1-3x each; standing R lat pull down red band  10x2, then sitting R lat pull down 10x R UE. Reviewed progress/current status with cervical and lumbar discomfort with movement with pt compared to initial evaluation.  Reviewed S/L lateral shift correction, then with R hip abduction and given as part of his HEP. Pt verbalized understanding.            PT Education - 03/24/15 1053    Education provided Yes   Education Details progress/current status, ther-ex, HEP   Person(s) Educated Patient   Methods Explanation;Demonstration;Tactile cues;Verbal cues;Handout   Comprehension Verbalized understanding;Returned demonstration             PT Long Term Goals - 03/24/15 1109    PT LONG TERM GOAL #1   Title Patient will be independent with his HEP to promote ability to perform functional tasks.    Time 6   Period Weeks   Status On-going   PT LONG TERM GOAL #2   Title Patient will report a decrease in neck pain to 2/10 or less at worst to improve ability to look around with less pain.   Time 6   Period Weeks   Status On-going   PT LONG TERM GOAL #3   Title Patient will report a decrease in back pain to 2/10 or less at worst to improve ability to perform functional tasks, perform driving tactics and defense tactics.    Time 6   Period Weeks   Status On-going   PT LONG TERM GOAL #4   Title Patient will improve his Neck Disability Index Score by at least 5 points as a demonstration of improved function.    Baseline 36%  Improved by 6% (3 points) 03/24/15   Time 6   Period Weeks   Status On-going   PT LONG TERM GOAL #5   Title Patient will improve his Quick Dash Score by at least 10% as a demonstration of improved function.    Baseline 20.5%   Time 6   Period Weeks   Status On-going               Plan - 03/24/15 5409    Clinical Impression Statement Patient demonstrates less discomfort with  L cervical rotation and L cervical side bend, and L lumbar side bend  as well as improved Neck Disability Index score by 6%  suggesing improved function since initial evaluation. Demonstrates some increased cervical discomfort with R cervical movements and increased Quick Dash Score however. Back pain decreased to 3/10 today  after L S/L lateral shift correction (with pillow) for T9/T10 with improved posture. Further decreased lateral shift in  aforementioned position during R hip abduction exercise. L dorsal foot discomfort felt with standing R lat pull down. No L dorsal foot discomfort with aforementioned exercise when performed in sitting position.  Overall patient making some progress towards improving cervical and lumbar function with less discomfort but still demonstrates difficulty with movement and function due to pain, and altered posture. Patient will benefit from continued skilled physical therapy intervention to address the aforementioned deficits to decrease pain, and to promote movement and function.    Pt will benefit from skilled therapeutic intervention in order to improve on the following deficits Increased muscle spasms;Pain;Improper body mechanics;Hypomobility;Postural dysfunction   Rehab Potential Good   PT Frequency 2x / week   PT Duration 6 weeks   PT Treatment/Interventions Therapeutic exercise;Therapeutic activities;Manual techniques;Moist Heat;Iontophoresis 4mg /ml Dexamethasone;Electrical Stimulation;Cryotherapy;Patient/family education;Passive range of motion;Aquatic Therapy;Neuromuscular re-education;Ultrasound;ADLs/Self Care Home Management   PT Next Visit Plan Posture, reciprocal inhibition, joint mobilizations, cervical, and lumbopelvic stability, posture   Consulted and Agree with Plan of Care Patient        Problem List There are no active problems to display for this patient.   Thank you for your referral.   Loralyn Freshwater PT, DPT  03/24/2015, 7:25 PM  [facility] PHYSICAL AND SPORTS MEDICINE [address] [place], [address] Phone:  [phone]   Fax:  [phone]

## 2015-03-24 NOTE — Patient Instructions (Addendum)
Gave L S/L lateral shift correction (to T9/T10) 5 min as well as with R hip abduction 10x3 part of his HEP. Handout provided.  Pt verbalized understanding.    Improved exercise technique, movement at target joints, use of target muscles after mod verbal, visual, tactile cues.

## 2015-03-26 ENCOUNTER — Ambulatory Visit: Payer: Worker's Compensation

## 2015-03-31 ENCOUNTER — Ambulatory Visit: Payer: Worker's Compensation

## 2015-04-02 ENCOUNTER — Ambulatory Visit: Payer: Worker's Compensation

## 2015-04-14 ENCOUNTER — Ambulatory Visit: Payer: Worker's Compensation | Admitting: Physical Therapy

## 2015-04-16 ENCOUNTER — Ambulatory Visit: Payer: Worker's Compensation | Attending: Neurology

## 2015-04-16 DIAGNOSIS — M545 Low back pain: Secondary | ICD-10-CM | POA: Insufficient documentation

## 2015-04-16 DIAGNOSIS — M542 Cervicalgia: Secondary | ICD-10-CM | POA: Insufficient documentation

## 2015-04-16 DIAGNOSIS — M546 Pain in thoracic spine: Secondary | ICD-10-CM | POA: Insufficient documentation

## 2015-05-05 ENCOUNTER — Ambulatory Visit: Payer: Worker's Compensation

## 2015-05-05 DIAGNOSIS — M542 Cervicalgia: Secondary | ICD-10-CM

## 2015-05-05 DIAGNOSIS — M546 Pain in thoracic spine: Secondary | ICD-10-CM

## 2015-05-05 DIAGNOSIS — M545 Low back pain, unspecified: Secondary | ICD-10-CM

## 2015-05-05 NOTE — Therapy (Signed)
[place] [address], [place], [address] Phone: [phone]   Fax:  [phone]  Physical Therapy Treatment  Patient Details  Name: Tristan Long MRN: [id number] Date of Birth: 04/12/1966 Referring Provider:  Rosary Lively, MD  Encounter Date: 05/05/2015      PT End of Session - 05/05/15 1606    Visit Number 8   Number of Visits 14   Date for PT Re-Evaluation 05/28/15   Authorization Type 1   Authorization Time Period of 6 authorized visits   PT Start Time 1605   PT Stop Time 1650   PT Time Calculation (min) 45 min   Activity Tolerance Patient tolerated treatment well   Behavior During Therapy [facility] for tasks assessed/performed      Past Medical History  Diagnosis Date  . Allergy     seasonal    Past Surgical History  Procedure Laterality Date  . 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  Visit Diagnosis:  Neck pain  Bilateral low back pain without sciatica  Bilateral thoracic back pain      Subjective Assessment - 05/05/15 1606    Subjective Pt states that he thought that he was doing better when he was doing physical therapy.  Currently taking flexeril. Neck pain current: 3/10, back current: 3/10. Pt states that his back is bothering him a lot currently.    Patient Stated Goals "To have less pain, to be able to function a little bit better."   Currently in Pain? Yes            [facility] PT Assessment - 05/05/15 1611    Observation/Other Assessments   Observations (+) Long sit test suggesting posterior nutation L innominate.    AROM   Cervical Flexion limited   Cervical Extension limited with discomfort to T1. Movement preference to T1/C7, C6/C7   Cervical - Right Side Bend limited with stiffness, discomfort around T1/C7  movement preference to C7/T1   Cervical - Left Side Bend limited with stiffness (less  discomfort compared to R side bend)   Improved since eval   Cervical - Right Rotation Limited with left cervical discomfort "sticking" sensation.    Cervical - Left Rotation limited without discomfort  Improved since eval   Lumbar Flexion limited with "sticking" sensation to around T9/T10 and discomfort towards upper thoracic spine   Lumbar Extension limited with pain around T9/T10. Slight R trunk rotation   Lumbar - Right Side Bend limited with thoracic pain T9/T10   Lumbar - Left Side Bend limited   Lumbar - Right Rotation limited with T9/T10 discomfort. Limited spinal movement.   Lumbar - Left Rotation limited, limited spinal movement.      Objectives:    There-ex:   Directed patient with cervical flexion, extension, side bending (R and L), rotation (R and L); lumbar: flexion, extension, side bending (R and L), rotation (R and L) 1-2x each way,  L S/L position with pillow and towel under T9/T0: R hip abduction SLR 10x3,   then with R shoulder abduction (tumbs up) to end range 3 lbs 10x3,   standing L shoulder extension resisting green band 10x3 with 5 second holds,  Sit <> stand using R LE only with emphasis on R glute max use 10x2,   Seated L hip flexion 10x5 seconds, then L hip flexion isometrics 10x2 with 5 second  holds.   Improved exercise technique, movement at target joints, use of target muscles after mod verbal, visual, tactile cues.    Improved more neutral back posture with L S/L shoulder and hip abduction exercises as well as standing L shoulder extension exercises.           PT Education - 05/05/15 1647    Education provided Yes   Education Details Ther-ex   Person(s) Educated Patient   Methods Explanation;Demonstration;Tactile cues;Verbal cues   Comprehension Verbalized understanding;Returned demonstration             PT Long Term Goals - 03/24/15 1109    PT LONG TERM GOAL #1   Title Patient will be independent with his HEP to promote ability  to perform functional tasks.    Time 6   Period Weeks   Status On-going   PT LONG TERM GOAL #2   Title Patient will report a decrease in neck pain to 2/10 or less at worst to improve ability to look around with less pain.   Time 6   Period Weeks   Status On-going   PT LONG TERM GOAL #3   Title Patient will report a decrease in back pain to 2/10 or less at worst to improve ability to perform functional tasks, perform driving tactics and defense tactics.    Time 6   Period Weeks   Status On-going   PT LONG TERM GOAL #4   Title Patient will improve his Neck Disability Index Score by at least 5 points as a demonstration of improved function.    Baseline 36%  Improved by 6% (3 points) 03/24/15   Time 6   Period Weeks   Status On-going   PT LONG TERM GOAL #5   Title Patient will improve his Quick Dash Score by at least 10% as a demonstration of improved function.    Baseline 20.5%   Time 6   Period Weeks   Status On-going               Plan - 05/05/15 1649    Clinical Impression Statement Pt states back pain decreased to 2/10 in L S/L with pillow.    Pt will benefit from skilled therapeutic intervention in order to improve on the following deficits Increased muscle spasms;Pain;Improper body mechanics;Hypomobility;Postural dysfunction   Rehab Potential Good   PT Frequency 2x / week   PT Duration 6 weeks   PT Treatment/Interventions Therapeutic exercise;Therapeutic activities;Manual techniques;Moist Heat;Iontophoresis /ml Dexamethasone;Electrical Stimulation;Cryotherapy;Patient/family education;Passive range of motion;Aquatic Therapy;Neuromuscular re-education;Ultrasound;ADLs/Self Care Home Management   PT Next Visit Plan Posture, reciprocal inhibition, joint mobilizations, cervical, and lumbopelvic stability, posture   Consulted and Agree with Plan of Care Patient        Problem List There are no active problems to display for this patient.  Thank you for your  referral.  Loralyn Freshwater PT, DPT   05/05/2015, 7:43 PM  [facility] PHYSICAL AND SPORTS MEDICINE [address], [place], [address] Phone: [phone]   Fax:  [phone]

## 2015-05-12 ENCOUNTER — Ambulatory Visit: Payer: Worker's Compensation

## 2015-05-12 DIAGNOSIS — M545 Low back pain, unspecified: Secondary | ICD-10-CM

## 2015-05-12 DIAGNOSIS — M546 Pain in thoracic spine: Secondary | ICD-10-CM

## 2015-05-12 DIAGNOSIS — M542 Cervicalgia: Secondary | ICD-10-CM | POA: Diagnosis not present

## 2015-05-12 NOTE — Therapy (Signed)
[facility]TS [facility]INE [address] [place], [address] Phone: [phone]   Fax:  [phone]  Physical Therapy Treatment  Patient Details  Name: Tristan Long MRN: [id number] Date of Birth: 1966/01/14 Referring Provider:  Rosary Lively, MD  Encounter Date: 05/12/2015      PT End of Session - 05/12/15 1033    Visit Number 9   Number of Visits 14   Date for PT Re-Evaluation 05/28/15   Authorization Type 2   Authorization Time Period of 6 authorized visits   PT Start Time 1033   PT Stop Time 1115   PT Time Calculation (min) 42 min   Activity Tolerance Patient tolerated treatment well   Behavior During Therapy [facility] for tasks assessed/performed      Past Medical History  Diagnosis Date  . Allergy     seasonal    Past Surgical History  Procedure Laterality Date  . 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  Visit Diagnosis:  Neck pain  Bilateral low back pain without sciatica  Bilateral thoracic back pain      Subjective Assessment - 05/12/15 1033    Subjective Pt states feeling sore the next day after last session but was able to function. Feels stiff and sore today. Feels stiff R side of his neck.    Patient Stated Goals "To have less pain, to be able to function a little bit better."   Currently in Pain? No/denies  Mainly complains of stiffness today   Pain Score --  no pain level provided     Objectives:  There-ex:  Directed pt with sitting on physioball: lateral pelvic tilts 10x3 each direction, L weight shifting onto L foot 10x3 sitting on physioball (to increase L paraspinal activation and decrease R paraspinal activation),  Sitting on physioball with arms folded gentle R trunk rotation (improved more netural spine posture, decreased R thoracic and lumbar spinal rotation position at rest) Standing L shoulder extension  resisting green band 10x5 seconds with manual pressure to L trunk to decrease R side bend at T9/T10 L shoulder adduction green band with manual pressure to L trunk (to  decrease R side bend at T9/T10), Standing L trunk side bend 10x2, then with L foot on stool 10x (decreased L dorsal foot discomfort when foot propped onto stool to decrease L lumbar extension in standing) Answered pt questions pertaining to possible reasons for L rib discomfort, as well as T9/T10 disc related issues.   Improved exercise technique, movement at target joints, use of target muscles after mod verbal, visual, tactile cues.    Pt states feeling more flexible after session. Decreased mid thoracic discomfort with manual pressure to L side at T9/T10 during exercises.                            PT Education - 05/12/15 1947    Education provided Yes   Education Details ther-ex   [employer]) Educated Patient   Methods Explanation;Demonstration;Tactile cues;Verbal cues   Comprehension Verbalized understanding;Returned demonstration             PT Long Term Goals - 05/05/15 2002    PT LONG TERM GOAL #1   Title Patient will be independent with his HEP to promote ability to perform functional tasks.    Time 6   Period Weeks  Status On-going   PT LONG TERM GOAL #2   Title Patient will report a decrease in neck pain to 2/10 or less at worst to improve ability to look around with less pain.   Time 6   Period Weeks   Status On-going   PT LONG TERM GOAL #3   Title Patient will report a decrease in back pain to 2/10 or less at worst to improve ability to perform functional tasks, perform driving tactics and defense tactics.    Time 6   Period Weeks   Status On-going   PT LONG TERM GOAL #4   Title Patient will improve his Neck Disability Index Score by at least 5 points as a demonstration of improved function.    Baseline 36%  Improved by 6% (3 points) 03/24/15   Time 6   Period Weeks    Status On-going   PT LONG TERM GOAL #5   Title Patient will improve his Quick Dash Score by at least 10% as a demonstration of improved function.    Baseline 20.5%   Time 6   Period Weeks   Status On-going               Plan - 05/12/15 1949    Clinical Impression Statement Pt states feeling more flexible after session. Decreased mid thoracic discomfort with manual pressure to L side at T9/T10 during exercises.    Pt will benefit from skilled therapeutic intervention in order to improve on the following deficits Increased muscle spasms;Pain;Improper body mechanics;Hypomobility;Postural dysfunction   Rehab Potential Good   PT Frequency 2x / week   PT Duration 3 weeks   PT Treatment/Interventions Therapeutic exercise;Therapeutic activities;Manual techniques;Moist Heat;Iontophoresis /ml Dexamethasone;Electrical Stimulation;Cryotherapy;Patient/family education;Passive range of motion;Aquatic Therapy;Neuromuscular re-education;Ultrasound;ADLs/Self Care Home Management   PT Next Visit Plan Posture, reciprocal inhibition, joint mobilizations, cervical, and lumbopelvic stability, posture   Consulted and Agree with Plan of Care Patient        Problem List There are no active problems to display for this patient.  Loralyn Freshwater PT, DPT  05/12/2015, 7:59 PM  [facility] PHYS[facility] AND SPORTS MEDICINE [address] [place], [address] Phone: [phone]   Fax:  [phone]

## 2015-05-14 ENCOUNTER — Ambulatory Visit: Payer: Worker's Compensation

## 2015-05-14 DIAGNOSIS — M546 Pain in thoracic spine: Secondary | ICD-10-CM

## 2015-05-14 DIAGNOSIS — M542 Cervicalgia: Secondary | ICD-10-CM

## 2015-05-14 DIAGNOSIS — M545 Low back pain, unspecified: Secondary | ICD-10-CM

## 2015-05-14 NOTE — Therapy (Signed)
[facility] PHYSICAL [facility] SPORTS MEDICINE [address], [place], [address] Phone: [phone]   Fax:  [phone]  Physical Therapy Treatment  Patient Details  Name: Tristan Long MRN: [id number] Date of Birth: 06/08/1966 Referring Ricca Melgarejo:  Rosary Lively, MD  Encounter Date: 05/14/2015      PT End of Session - 05/14/15 0814    Visit Number 10   Number of Visits 14   Date for PT Re-Evaluation 05/28/15   Authorization Type 3   Authorization Time Period of 6 authorized visits   PT Start Time 0815  Pt arrived late today   PT Stop Time 0849   PT Time Calculation (min) 34 min   Activity Tolerance Patient tolerated treatment well   Behavior During Therapy [facility] for tasks assessed/performed      Past Medical History  Diagnosis Date  . Allergy     seasonal    Past Surgical History  Procedure Laterality Date  . 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  Visit Diagnosis:  Neck pain  Bilateral low back pain without sciatica  Bilateral thoracic back pain      Subjective Assessment - 05/14/15 0816    Subjective Pt states that the ball exercises might be a little much. Was sore. Had to take it easy yesterday. 4/10 back pain. Stiff neck, able to turn his head better to L. Stiff to the R.    Patient Stated Goals "To have less pain, to be able to function a little bit better."        Objectives:  There-ex Directed patient with L S/L with pillow and towel under L side at T9/T10 area, position and adjusted for comfort throughout session,  Added pillows to L head in L S/L position to promote more neutral spine position, (no L hand numbness afterwards; pt education on L cervical side bend and effects on L UE tingling and numbness)  Then with R hip abduction 10x3 Then with R shoulder abduction 10x with 3 lbs weight.     Manual therapy: L S/L with pillow under  L thoracic spine around T9/T10: general lumbar rotation grade 1, grade 2 (decreased back pain to 3/10)       Decreased back pain to 3/10 after manual therapy. Improved more neutral back positioning in L S/L with pillow under L thoracic spine and R hip abduction exercise.                     PT Education - 05/14/15 [phone]    Education provided Yes   Education Details ther-ex   Person(s) Educated Patient   Methods Explanation;Demonstration;Tactile cues;Verbal cues   Comprehension Verbalized understanding;Returned demonstration             PT Long Term Goals - 05/05/15 2002    PT LONG TERM GOAL #1   Title Patient will be independent with his HEP to promote ability to perform functional tasks.    Time 6   Period Weeks   Status On-going   PT LONG TERM GOAL #2   Title Patient will report a decrease in neck pain to 2/10 or less at worst to improve ability to look around with less pain.   Time 6   Period Weeks   Status On-going   PT LONG TERM GOAL #3   Title Patient will report a decrease in back pain  to 2/10 or less at worst to improve ability to perform functional tasks, perform driving tactics and defense tactics.    Time 6   Period Weeks   Status On-going   PT LONG TERM GOAL #4   Title Patient will improve his Neck Disability Index Score by at least 5 points as a demonstration of improved function.    Baseline 36%  Improved by 6% (3 points) 03/24/15   Time 6   Period Weeks   Status On-going   PT LONG TERM GOAL #5   Title Patient will improve his Quick Dash Score by at least 10% as a demonstration of improved function.    Baseline 20.5%   Time 6   Period Weeks   Status On-going               Plan - 05/14/15 1610    Clinical Impression Statement Decreased back pain to 3/10 after manual therapy. Improved more neutral back positioning in L S/L with pillow under L thoracic spine and R hip abduction exercise.    Pt will benefit from skilled therapeutic  intervention in order to improve on the following deficits Increased muscle spasms;Pain;Improper body mechanics;Hypomobility;Postural dysfunction   Rehab Potential Good   PT Frequency 2x / week   PT Duration 3 weeks   PT Treatment/Interventions Therapeutic exercise;Therapeutic activities;Manual techniques;Moist Heat;Iontophoresis /ml Dexamethasone;Electrical Stimulation;Cryotherapy;Patient/family education;Passive range of motion;Aquatic Therapy;Neuromuscular re-education;Ultrasound;ADLs/Self Care Home Management   PT Next Visit Plan Posture, reciprocal inhibition, joint mobilizations, cervical, and lumbopelvic stability, posture   Consulted and Agree with Plan of Care Patient        Problem List There are no active problems to display for this patient.  Loralyn Freshwater PT, DPT  05/14/2015, 10:55 AM  [facility] PHYSICAL AND SPORTS MEDICINE [address] [place], [address] Phone: [phone]   Fax:  [phone]

## 2015-05-19 ENCOUNTER — Ambulatory Visit: Payer: Worker's Compensation | Attending: Neurology

## 2015-05-19 DIAGNOSIS — M546 Pain in thoracic spine: Secondary | ICD-10-CM | POA: Diagnosis present

## 2015-05-19 DIAGNOSIS — M545 Low back pain, unspecified: Secondary | ICD-10-CM

## 2015-05-19 DIAGNOSIS — M542 Cervicalgia: Secondary | ICD-10-CM | POA: Diagnosis present

## 2015-05-19 NOTE — Patient Instructions (Addendum)
Standing up with good posture, tap left foot onto first regular step comfortably at least 3 sets of 10.   Standing up with good posture, tap left foot back comfortably 3 sets of 10.   Modify your left shoulder extension using green band by propping your left foot onto a stool.

## 2015-05-19 NOTE — Therapy (Signed)
[facility] PHYS[facility] SPORTS MEDICINE [address] [place], [address] Phone: [phone]   Fax:  [phone]  Physical Therapy Treatment  Patient Details  Name: Tristan Long MRN: [id number] Date of Birth: Jun 06, 1966 Referring Provider:  Rosary Lively, MD  Encounter Date: 05/19/2015      PT End of Session - 05/19/15 1034    Visit Number 11   Number of Visits 14   Date for PT Re-Evaluation 05/28/15   Authorization Type 4   Authorization Time Period of 6 authorized visits   PT Start Time 1034   PT Stop Time 1117   PT Time Calculation (min) 43 min   Activity Tolerance Patient tolerated treatment well   Behavior During Therapy [facility] for tasks assessed/performed      Past Medical History  Diagnosis Date  . Allergy     seasonal    Past Surgical History  Procedure Laterality Date  . 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  Visit Diagnosis:  Neck pain  Bilateral low back pain without sciatica  Bilateral thoracic back pain      Subjective Assessment - 05/19/15 1037    Subjective Pt was able to stand for about 45 min then felt stiffness in his back this weekend. Neck 2/10 and 3/13 back currently. Back pain eases with gabapentin   Patient Stated Goals "To have less pain, to be able to function a little bit better."   Currently in Pain? Yes   Pain Score 3    Multiple Pain Sites Yes  neck and back       Objectives  There-ex Directed patient with L foot tap onto stool 10x3, L trunk side bend with L foot propped onto stool 10x3, L foot propped onto stool with body blade R UE (side to side motion gentle ER/IR) 30 seconds x 5,  L foot propped onto stool with L shoulder extension resisting green band 10x3 with 5 second holds Forward step up with R LE, and down with R LE 3x onto 3 inch step, Gentle L backward toe tap with L 10x3, L S/L with pillow  under T9/T10: R shoulder abduction 3 lbs 10x3, L hip abduction 1 lb 10x  Improved exercise technique, movement at target joints, use of target muscles after mod verbal, visual, tactile cues.    More neutral low back spine alignment with L foot propped on step stool, tapping L foot onto step stool, and backward toe tap on L.                          PT Education - 05/19/15 1042    Education provided Yes   Education Details ther-ex, HEP   Person(s) Educated Patient   Methods Explanation;Demonstration;Tactile cues;Verbal cues   Comprehension Verbalized understanding;Returned demonstration             PT Long Term Goals - 05/05/15 2002    PT LONG TERM GOAL #1   Title Patient will be independent with his HEP to promote ability to perform functional tasks.    Time 6   Period Weeks   Status On-going   PT LONG TERM GOAL #2   Title Patient will report a decrease in neck pain to 2/10 or less at worst to improve ability to look around with less pain.   Time 6   Period  Weeks   Status On-going   PT LONG TERM GOAL #3   Title Patient will report a decrease in back pain to 2/10 or less at worst to improve ability to perform functional tasks, perform driving tactics and defense tactics.    Time 6   Period Weeks   Status On-going   PT LONG TERM GOAL #4   Title Patient will improve his Neck Disability Index Score by at least 5 points as a demonstration of improved function.    Baseline 36%  Improved by 6% (3 points) 03/24/15   Time 6   Period Weeks   Status On-going   PT LONG TERM GOAL #5   Title Patient will improve his Quick Dash Score by at least 10% as a demonstration of improved function.    Baseline 20.5%   Time 6   Period Weeks   Status On-going               Plan - 05/19/15 1046    Clinical Impression Statement More neutral low back spine alignment with L foot propped on step stool, tapping L foot onto step stool, and backward toe tap on L.   Decreased back pain to 2/10 after session.    Pt will benefit from skilled therapeutic intervention in order to improve on the following deficits Increased muscle spasms;Pain;Improper body mechanics;Hypomobility;Postural dysfunction   Rehab Potential Good   PT Frequency 2x / week   PT Duration 3 weeks   PT Treatment/Interventions Therapeutic exercise;Therapeutic activities;Manual techniques;Moist Heat;Iontophoresis /ml Dexamethasone;Electrical Stimulation;Cryotherapy;Patient/family education;Passive range of motion;Aquatic Therapy;Neuromuscular re-education;Ultrasound;ADLs/Self Care Home Management   PT Next Visit Plan Posture, reciprocal inhibition, joint mobilizations, cervical, and lumbopelvic stability, posture   Consulted and Agree with Plan of Care Patient        Problem List There are no active problems to display for this patient.   Loralyn Freshwater PT, DPT   05/19/2015, 7:39 PM  [facility] PHYSICAL AND SPORTS MEDICINE [address] [place], [address] Phone: [phone]   Fax:  [phone]

## 2015-05-21 ENCOUNTER — Ambulatory Visit: Payer: Worker's Compensation

## 2015-05-21 DIAGNOSIS — M546 Pain in thoracic spine: Secondary | ICD-10-CM

## 2015-05-21 DIAGNOSIS — M545 Low back pain, unspecified: Secondary | ICD-10-CM

## 2015-05-21 DIAGNOSIS — M542 Cervicalgia: Secondary | ICD-10-CM | POA: Diagnosis not present

## 2015-05-21 NOTE — Patient Instructions (Addendum)
Pt was also recommended to try shaking a water bottle ER/IR gently with his R hand, and L foot propped on a stool at home as part of his HEP. Pt demonstrated and verbalized understanding.

## 2015-05-21 NOTE — Therapy (Signed)
[place] [facility] [address], [place], [address] Phone: [phone]   Fax:  [phone]  Physical Therapy Treatment  Patient Details  Name: Tristan Long MRN: [id number] Date of Birth: 1965-10-11 Referring Provider:  Rosary Lively, MD  Encounter Date: 05/21/2015      PT End of Session - 05/21/15 0926    Visit Number 12   Number of Visits 14   Date for PT Re-Evaluation 05/28/15   Authorization Type 5   Authorization Time Period of 6 authorized visits   PT Start Time 0929   PT Stop Time 1019   PT Time Calculation (min) 50 min   Activity Tolerance Patient tolerated treatment well   Behavior During Therapy [facility] for tasks assessed/performed      Past Medical History  Diagnosis Date  . Allergy     seasonal    Past Surgical History  Procedure Laterality Date  . 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  Visit Diagnosis:  Neck pain  Bilateral low back pain without sciatica  Bilateral thoracic back pain      Subjective Assessment - 05/21/15 0926    Subjective Pt states that his back was sore later the day after last session. Current pain: neck 3/10, back 4/10   Patient Stated Goals "To have less pain, to be able to function a little bit better."   Currently in Pain? Yes   Pain Score 4    Multiple Pain Sites Yes         Objectives   There-ex Directed patient with L foot tap onto stool 10x6 (initially felt L low back discomfort which decreased with increased repetition), Gentle L backward toe tap with L 10x3, L foot propped onto stool with L shoulder extension resisting blue band 10x3, L shoulder extension resisting blue band 10x with L heel raise position  L foot propped on stool with ER/IR bottle shake 30 seconds (decreased back discomfort; reviewed and given as part of his HEP; pt demonstrated and verablized  understanding.  L S/L with pillow under T9/T10: R shoulder abduction 3 lbs 10x, L S/L R hip abduction 1 lb 5x, then no weight 5x  Improved exercise technique, movement at target joints, use of target muscles after mod verbal, visual, tactile cues.    Some back discomfort after S/L (side lying) exercises today. Decreased back discomfort when pt performed ER/IR bottle shake exercise with L foot on stool to promote gentle movement to his back.                       PT Education - 05/21/15 1949    Education provided Yes   Education Details ther-ex   [employer]) Educated Patient   Methods Explanation;Demonstration;Tactile cues;Verbal cues   Comprehension Verbalized understanding;Returned demonstration             PT Long Term Goals - 05/05/15 2002    PT LONG TERM GOAL #1   Title Patient will be independent with his HEP to promote ability to perform functional tasks.    Time 6   Period Weeks   Status On-going   PT LONG TERM GOAL #2   Title Patient will report a decrease in neck pain to 2/10 or less at worst to improve ability to look around with less pain.   Time 6   Period Weeks  Status On-going   PT LONG TERM GOAL #3   Title Patient will report a decrease in back pain to 2/10 or less at worst to improve ability to perform functional tasks, perform driving tactics and defense tactics.    Time 6   Period Weeks   Status On-going   PT LONG TERM GOAL #4   Title Patient will improve his Neck Disability Index Score by at least 5 points as a demonstration of improved function.    Baseline 36%  Improved by 6% (3 points) 03/24/15   Time 6   Period Weeks   Status On-going   PT LONG TERM GOAL #5   Title Patient will improve his Quick Dash Score by at least 10% as a demonstration of improved function.    Baseline 20.5%   Time 6   Period Weeks   Status On-going               Plan - 05/21/15 1950    Clinical Impression Statement Some back discomfort after  S/L (side lying) exercises today. Decreased back discomfort when pt performed ER/IR bottle shake exercise with L foot on stool to promote gentle movement to his back.    Pt will benefit from skilled therapeutic intervention in order to improve on the following deficits Increased muscle spasms;Pain;Improper body mechanics;Hypomobility;Postural dysfunction   Rehab Potential Good   PT Frequency 2x / week   PT Duration 3 weeks   PT Treatment/Interventions Therapeutic exercise;Therapeutic activities;Manual techniques;Moist Heat;Iontophoresis /ml Dexamethasone;Electrical Stimulation;Cryotherapy;Patient/family education;Passive range of motion;Aquatic Therapy;Neuromuscular re-education;Ultrasound;ADLs/Self Care Home Management   PT Next Visit Plan Posture, reciprocal inhibition, joint mobilizations, cervical, and lumbopelvic stability, posture   Consulted and Agree with Plan of Care Patient        Problem List There are no active problems to display for this patient.   Loralyn Freshwater PT, DPT   05/21/2015, 8:00 PM  [place] [facility] SPORTS MEDICINE [address] [place], [address] Phone: [phone]   Fax:  [phone]

## 2015-05-26 ENCOUNTER — Ambulatory Visit: Payer: Worker's Compensation

## 2015-05-26 DIAGNOSIS — M545 Low back pain, unspecified: Secondary | ICD-10-CM

## 2015-05-26 DIAGNOSIS — M542 Cervicalgia: Secondary | ICD-10-CM

## 2015-05-26 DIAGNOSIS — M546 Pain in thoracic spine: Secondary | ICD-10-CM

## 2015-05-26 NOTE — Therapy (Signed)
[place] [facility] [address] [place], [address] Phone: [phone]   Fax:  [phone]  Physical Therapy Treatment  Patient Details  Name: Tristan Long MRN: [id number] Date of Birth: 1966/04/21 Referring Provider:  Rosary Lively, MD  Encounter Date: 05/26/2015      PT End of Session - 05/26/15 1033    Visit Number 13   Number of Visits 23   Date for PT Re-Evaluation 06/25/15   Authorization Type 6   Authorization Time Period of 6 authorized visits   PT Start Time 1033   PT Stop Time 1118   PT Time Calculation (min) 45 min   Activity Tolerance Patient tolerated treatment well   Behavior During Therapy [facility] for tasks assessed/performed      Past Medical History  Diagnosis Date  . Allergy     seasonal    Past Surgical History  Procedure Laterality Date  . 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  Visit Diagnosis:  Neck pain - Plan: PT plan of care cert/re-cert  Bilateral low back pain without sciatica - Plan: PT plan of care cert/re-cert  Bilateral thoracic back pain - Plan: PT plan of care cert/re-cert      Subjective Assessment - 05/26/15 1034    Subjective Pt states having pain after last session. Getting injection into his back tomorrow. Pt states that he feels like he has come further than what he was. This morning, neck feels stiff. Back feels sore. Feels like the weather change affects his symptoms.  Neck 3/10 currently, back 4/10 currently. Pt states that his symptoms get better as the day progresses.    Patient Stated Goals "To have less pain, to be able to function a little bit better."   Currently in Pain? Yes   Pain Score 4    Multiple Pain Sites Yes  neck and back      There-ex  Directed pt with cervical AROM: flexion, extension, side bend (R and L), rotation (R and L) 1-2x each way Lumbar AROM: flexion,  extension, side bend (R and L), rotation (R and L) 1-2x each way, Standing back extension with manual pressure from PT to decrease T9/T10 curve and to promote more neutral spine posture 3x5, Standing trunk extension with gentle L rotation 2x5 to promote better positioning at T9/10 (reviewed and given as part of his HEP), decreased back pain to 1/10, manually resisted R scapular retraction while rotating neck to R 10x (decreased neck pain with R cervical rotation; used R rhomboid minor to promote more neutral C7 position). Reviewed and given R scapular retraction with R cervical rotation as part of his HEP. Pt demonstrated and verbalized understanding. Discussed plan of care with pt.  Improved exercise technique, movement at target joints, use of target muscles after mod verbal, visual, tactile cues.    Manual therapy: Soft tissue mobilization to L rhomboid minor          [facility] PT Assessment - 05/26/15 1039    Observation/Other Assessments   Neck Disability Index  28%   Quick DASH  34.1%   AROM   Cervical Flexion limited   Cervical Extension limited with R lateral neck discomfort; movement preference to C5/C6   Cervical - Right Side Bend limited with discomfort (dull ache) to R lateral neck   Cervical - Left Side Bend limited with R lateral neck  discomfort (not as bad as R side bending)   Cervical - Right Rotation limited with slight R lateral neck discomfort   Cervical - Left Rotation Limited without discomfort   Lumbar Flexion limited with "sticking" sensation to around T9/T10 and discomfort   Lumbar Extension limited with pain around T9/T10.   Lumbar - Right Side Bend limited   Lumbar - Left Side Bend limited   Lumbar - Right Rotation limited with L dorsal foot pain   Lumbar - Left Rotation limited                             PT Education - 05/26/15 1256    Education provided Yes   Education Details ther-ex, HEP   Person(s) Educated Patient   Methods  Explanation;Demonstration;Tactile cues;Verbal cues   Comprehension Verbalized understanding;Returned demonstration             PT Long Term Goals - 05/26/15 1301    PT LONG TERM GOAL #1   Title Patient will be independent with his HEP to promote ability to perform functional tasks.    Time 6   Period Weeks   Status On-going   PT LONG TERM GOAL #2   Title Patient will report a decrease in neck pain to 2/10 or less at worst to improve ability to look around with less pain.   Time 6   Period Weeks   Status On-going   PT LONG TERM GOAL #3   Title Patient will report a decrease in back pain to 2/10 or less at worst to improve ability to perform functional tasks, perform driving tactics and defense tactics.    Time 6   Period Weeks   Status On-going   PT LONG TERM GOAL #4   Title Patient will improve his Neck Disability Index Score by at least 5 points as a demonstration of improved function.    Baseline 36%  Improved by 6% (3 points) 03/24/15   Time 6   Period Weeks   Status On-going   PT LONG TERM GOAL #5   Title Patient will improve his Quick Dash Score by at least 10% as a demonstration of improved function.    Baseline 20.5%   Time 6   Period Weeks   Status On-going               Plan - 05/26/15 1259    Clinical Impression Statement Decreased back pain to 1/10 after exercises  to promote more neutral lumbar and thoracic spine posture. Decreased R  lateral neck discomfort with R cervical rotation when R rhomboid minor was  utilized to promote more neutral position of spine around C7. Overall, pt  maintained gains in L cervical rotation and L cervical side bend movement  with less discomfort compared to initial evaluation. Pt is also better  able to perform R lumbar side bend with less discomfort compared to  initial evaluation and most recent progress report as well as consistent  improvement with his Neck Disability Index scores suggesting improved  function.  Pt still demonstrates difficulty moving his neck and back for  his daily tasks due to pain and discomfort and when performing functional activities and would  benefit from continued skilled physical therapy services to improve  movement with less pain and discomfort and to improve ability to perform  functional tasks.      Pt will benefit from skilled therapeutic intervention in order to improve on  the following deficits Increased muscle spasms;Pain;Improper body mechanics;Hypomobility;Postural dysfunction   Rehab Potential Good   PT Frequency 2x / week   PT Duration 4 weeks  6-8 visits   PT Treatment/Interventions Therapeutic exercise;Therapeutic activities;Manual techniques;Moist Heat;Iontophoresis /ml Dexamethasone;Electrical Stimulation;Cryotherapy;Patient/family education;Passive range of motion;Aquatic Therapy;Neuromuscular re-education;Ultrasound;ADLs/Self Care Home Management   PT Next Visit Plan Posture, reciprocal inhibition, joint mobilizations, cervical, and lumbopelvic stability, posture   Consulted and Agree with Plan of Care Patient        Problem List There are no active problems to display for this patient.  Thank you for your referral.   Loralyn Freshwater PT, DPT   05/26/2015, 1:33 PM  [place] [facility] SPORTS MEDIC[facility] [address] [place], [address] Phone: [phone]   Fax:  [phone]

## 2015-05-26 NOTE — Patient Instructions (Signed)
Gave standing back extension with L trunk rotation 5x3  and R cervical rotation with R scapular retraction exercises as part of his home exercise program.  Pt demonstrated and verbalized understanding.
